# Patient Record
Sex: Female | Born: 1940 | Race: White | Hispanic: No | Marital: Married | Smoking: Never smoker
Health system: Southern US, Community
[De-identification: ages and names within clinical notes are randomized; demographics above are authoritative.]

## PROBLEM LIST (undated history)

## (undated) DIAGNOSIS — E785 Hyperlipidemia, unspecified: Secondary | ICD-10-CM

## (undated) HISTORY — PX: INCONTINENCE SURGERY: SHX676

## (undated) HISTORY — PX: COLONOSCOPY: SHX174

## (undated) HISTORY — PX: ABDOMINAL HYSTERECTOMY: SHX81

## (undated) HISTORY — PX: APPENDECTOMY: SHX54

## (undated) HISTORY — PX: EYE SURGERY: SHX253

---

## 2016-01-17 ENCOUNTER — Ambulatory Visit: Payer: Medicare Other | Admitting: Family Medicine

## 2016-03-28 ENCOUNTER — Encounter: Payer: Self-pay | Admitting: *Deleted

## 2016-03-31 ENCOUNTER — Ambulatory Visit
Admission: RE | Admit: 2016-03-31 | Discharge: 2016-03-31 | Disposition: A | Discharge status: Home / Self Care | Payer: Medicare Other | Source: Ambulatory Visit | Attending: Unknown Physician Specialty | Admitting: Unknown Physician Specialty

## 2016-03-31 ENCOUNTER — Ambulatory Visit: Payer: Medicare Other | Admitting: Certified Registered Nurse Anesthetist

## 2016-03-31 ENCOUNTER — Encounter
Admission: RE | Disposition: A | Discharge status: Home / Self Care | Payer: Self-pay | Source: Ambulatory Visit | Attending: Unknown Physician Specialty

## 2016-03-31 ENCOUNTER — Encounter: Payer: Self-pay | Admitting: *Deleted

## 2016-03-31 DIAGNOSIS — Z1211 Encounter for screening for malignant neoplasm of colon: Secondary | ICD-10-CM | POA: Insufficient documentation

## 2016-03-31 DIAGNOSIS — K640 First degree hemorrhoids: Secondary | ICD-10-CM | POA: Insufficient documentation

## 2016-03-31 DIAGNOSIS — Z79899 Other long term (current) drug therapy: Secondary | ICD-10-CM | POA: Insufficient documentation

## 2016-03-31 DIAGNOSIS — E785 Hyperlipidemia, unspecified: Secondary | ICD-10-CM | POA: Diagnosis not present

## 2016-03-31 DIAGNOSIS — K5730 Diverticulosis of large intestine without perforation or abscess without bleeding: Secondary | ICD-10-CM | POA: Diagnosis not present

## 2016-03-31 DIAGNOSIS — K529 Noninfective gastroenteritis and colitis, unspecified: Secondary | ICD-10-CM | POA: Insufficient documentation

## 2016-03-31 DIAGNOSIS — Z86010 Personal history of colonic polyps: Secondary | ICD-10-CM | POA: Insufficient documentation

## 2016-03-31 DIAGNOSIS — Z90710 Acquired absence of both cervix and uterus: Secondary | ICD-10-CM | POA: Diagnosis not present

## 2016-03-31 HISTORY — PX: COLONOSCOPY WITH PROPOFOL: SHX5780

## 2016-03-31 HISTORY — DX: Hyperlipidemia, unspecified: E78.5

## 2016-03-31 SURGERY — COLONOSCOPY WITH PROPOFOL
Anesthesia: General

## 2016-03-31 MED ORDER — SODIUM CHLORIDE 0.9 % IV SOLN: INTRAVENOUS | Status: DC

## 2016-03-31 MED ORDER — SODIUM CHLORIDE 0.9 % IV SOLN
INTRAVENOUS | Status: DC
  Administered 2016-03-31: 1000 mL via INTRAVENOUS

## 2016-03-31 MED ORDER — PROPOFOL 500 MG/50ML IV EMUL
INTRAVENOUS | Status: DC | PRN
  Administered 2016-03-31: 140 ug/kg/min via INTRAVENOUS

## 2016-03-31 MED ORDER — PROPOFOL 10 MG/ML IV BOLUS
INTRAVENOUS | Status: DC | PRN
  Administered 2016-03-31: 30 mg via INTRAVENOUS

## 2016-03-31 MED ORDER — MIDAZOLAM HCL 2 MG/2ML IJ SOLN
INTRAMUSCULAR | Status: DC | PRN
  Administered 2016-03-31: 1 mg via INTRAVENOUS

## 2016-03-31 NOTE — Anesthesia Preprocedure Evaluation (Signed)
Anesthesia Evaluation  Patient identified by MRN, date of birth, ID band Patient awake    Reviewed: Allergy & Precautions, H&P , NPO status , Patient's Chart, lab work & pertinent test results, reviewed documented beta blocker date and time   History of Anesthesia Complications Negative for: history of anesthetic complications  Airway Mallampati: II  TM Distance: >3 FB Neck ROM: full    Dental no notable dental hx. (+) Caps, Teeth Intact   Pulmonary neg pulmonary ROS,    Pulmonary exam normal breath sounds clear to auscultation       Cardiovascular Exercise Tolerance: Good negative cardio ROS Normal cardiovascular exam Rhythm:regular Rate:Normal     Neuro/Psych negative neurological ROS  negative psych ROS   GI/Hepatic negative GI ROS, Neg liver ROS,   Endo/Other  negative endocrine ROS  Renal/GU negative Renal ROS  negative genitourinary   Musculoskeletal   Abdominal   Peds  Hematology negative hematology ROS (+)   Anesthesia Other Findings Past Medical History: No date: Hyperlipidemia   Reproductive/Obstetrics negative OB ROS                             Anesthesia Physical Anesthesia Plan  ASA: II  Anesthesia Plan: General   Post-op Pain Management:    Induction:   Airway Management Planned:   Additional Equipment:   Intra-op Plan:   Post-operative Plan:   Informed Consent: I have reviewed the patients History and Physical, chart, labs and discussed the procedure including the risks, benefits and alternatives for the proposed anesthesia with the patient or authorized representative who has indicated his/her understanding and acceptance.   Dental Advisory Given  Plan Discussed with: Anesthesiologist, CRNA and Surgeon  Anesthesia Plan Comments:         Anesthesia Quick Evaluation

## 2016-03-31 NOTE — Anesthesia Procedure Notes (Signed)
Date/Time: 03/31/2016 11:23 AM Performed by: Ginger CarneMICHELET, Markus Casten Pre-anesthesia Checklist: Patient identified, Emergency Drugs available, Suction available, Patient being monitored and Timeout performed Patient Re-evaluated:Patient Re-evaluated prior to inductionOxygen Delivery Method: Nasal cannula

## 2016-03-31 NOTE — Transfer of Care (Signed)
Immediate Anesthesia Transfer of Care Note  Patient: Ariel SorrowJudy P Mandley  Procedure(s) Performed: Procedure(s): COLONOSCOPY WITH PROPOFOL (N/A)  Patient Location: PACU  Anesthesia Type:General  Level of Consciousness: sedated  Airway & Oxygen Therapy: Patient Spontanous Breathing and Patient connected to nasal cannula oxygen  Post-op Assessment: Report given to RN and Post -op Vital signs reviewed and stable  Post vital signs: Reviewed and stable  Last Vitals:  Vitals:   03/31/16 1016 03/31/16 1143  BP: 116/69 (!) 107/59  Pulse: 85 66  Resp: 16 15  Temp: 36.3 C 36.3 C    Last Pain:  Vitals:   03/31/16 1016  TempSrc: Tympanic         Complications: No apparent anesthesia complications

## 2016-03-31 NOTE — Op Note (Signed)
[facility] Patient Name: Ariel FeilJudy Hickman Procedure Date: 03/31/2016 11:11 AM MRN: [id number] Account #: [id number] Date of Birth: 04-18-1941 Admit Type: Outpatient Age: [age] [facility] Gender: Female Note Status: Finalized Procedure:            Colonoscopy Indications:          High risk colon cancer surveillance: Personal history                        of colonic polyps Providers:            Scot Junobert T. Hikeem Andersson, MD Referring MD:         Hassell HalimMarcus E. Babaoff MD (Referring MD) Medicines:            Propofol per Anesthesia Complications:        No immediate complications. Procedure:            Pre-Anesthesia Assessment:                       - After reviewing the risks and benefits, the patient                        was deemed in satisfactory condition to undergo the                        procedure.                       After obtaining informed consent, the colonoscope was                        passed under direct vision. Throughout the procedure,                        the patient's blood pressure, pulse, and oxygen                        saturations were monitored continuously. The                        Colonoscope was introduced through the anus and                        advanced to the the cecum, identified by appendiceal                        orifice and ileocecal valve. The colonoscopy was                        performed without difficulty. The patient tolerated the                        procedure well. The quality of the bowel preparation                        was excellent. Findings:      A few small-mouthed diverticula were found in the ascending colon.      Internal hemorrhoids were found during endoscopy. The hemorrhoids were       small and Grade I (internal hemorrhoids that do not prolapse).      The exam was  otherwise without abnormality. Impression:           - Diverticulosis in the ascending colon.   - Internal hemorrhoids.                       - The examination was otherwise normal.                       - No specimens collected. Recommendation:       - Repeat colonoscopy in 5 years for surveillance.                       - Resume previous diet indefinitely. Scot Jun, MD 03/31/2016 11:49:31 AM This report has been signed electronically. Number of Addenda: 0 Note Initiated On: 03/31/2016 11:11 AM Scope Withdrawal Time: 0 hours 8 minutes 10 seconds  Total Procedure Duration: 0 hours 15 minutes 55 seconds       [facility]

## 2016-03-31 NOTE — H&P (Signed)
   Primary Care Physician:  No PCP Per Patient Primary Gastroenterologist:  Dr. Mechele CollinElliott  Pre-Procedure History & Physical: HPI:  Ariel SorrowJudy P Lykens is a 75 y.o. female is here for an colonoscopy.   Past Medical History:  Diagnosis Date  . Hyperlipidemia     Past Surgical History:  Procedure Laterality Date  . ABDOMINAL HYSTERECTOMY    . APPENDECTOMY    . COLONOSCOPY    . INCONTINENCE SURGERY      Prior to Admission medications   Medication Sig Start Date End Date Taking? Authorizing Provider  Coenzyme Q10-Vitamin E (COQ10-VITAMIN E) 100-10 MG-UNIT CAPS Take by mouth.   Yes Historical Provider, MD  magnesium oxide (MAG-OX) 400 MG tablet Take 400 mg by mouth daily.   Yes Historical Provider, MD  omega-3 fish oil (MAXEPA) 1000 MG CAPS capsule Take by mouth.   Yes Historical Provider, MD  SELENIUM PO Take by mouth.   Yes Historical Provider, MD  simvastatin (ZOCOR) 10 MG tablet Take 10 mg by mouth daily.   Yes Historical Provider, MD  vitamin B-12 (CYANOCOBALAMIN) 1000 MCG tablet Take 1,000 mcg by mouth daily.   Yes Historical Provider, MD  vitamin E 100 UNIT capsule Take by mouth daily.   Yes Historical Provider, MD    Allergies as of 02/28/2016  . (Not on File)    History reviewed. No pertinent family history.  Social History   Social History  . Marital status: Married    Spouse name: N/A  . Number of children: N/A  . Years of education: N/A   Occupational History  . Not on file.   Social History Main Topics  . Smoking status: Never Smoker  . Smokeless tobacco: Never Used  . Alcohol use Not on file  . Drug use: Unknown  . Sexual activity: Not on file   Other Topics Concern  . Not on file   Social History Narrative  . No narrative on file    Review of Systems: See HPI, otherwise negative ROS  Physical Exam: BP 116/69   Pulse 85   Temp 97.3 F (36.3 C) (Tympanic)   Resp 16   Ht 5\' 4"  (1.626 m)   Wt 58.1 kg (128 lb)   SpO2 100%   BMI 21.97 kg/m   General:   Alert,  pleasant and cooperative in NAD Head:  Normocephalic and atraumatic. Neck:  Supple; no masses or thyromegaly. Lungs:  Clear throughout to auscultation.    Heart:  Regular rate and rhythm. Abdomen:  Soft, nontender and nondistended. Normal bowel sounds, without guarding, and without rebound.   Neurologic:  Alert and  oriented x4;  grossly normal neurologically.  Impression/Plan: Ariel Hickman is here for an colonoscopy to be [facility]  Risks, benefits, limitations, and alternatives regarding  colonoscopy have been reviewed with the patient.  Questions have been answered.  All parties agreeable.   Lynnae PrudeELLIOTT, Cleopatra Sardo, MD  03/31/2016, 11:16 AM

## 2016-04-01 ENCOUNTER — Encounter: Payer: Self-pay | Admitting: Unknown Physician Specialty

## 2016-04-01 LAB — SURGICAL PATHOLOGY

## 2016-04-01 NOTE — Anesthesia Postprocedure Evaluation (Signed)
Anesthesia Post Note  Patient: Ariel Hickman  Procedure(s) Performed: Procedure(s) (LRB): COLONOSCOPY WITH PROPOFOL (N/A)  Patient location during evaluation: Endoscopy Anesthesia Type: General Level of consciousness: awake and alert Pain management: pain level controlled Vital Signs Assessment: post-procedure vital signs reviewed and stable Respiratory status: spontaneous breathing, nonlabored ventilation, respiratory function stable and patient connected to nasal cannula oxygen Cardiovascular status: blood pressure returned to baseline and stable Postop Assessment: no signs of nausea or vomiting Anesthetic complications: no    Last Vitals:  Vitals:   03/31/16 1203 03/31/16 1213  BP: 118/63 127/67  Pulse: (!) 58 (!) 53  Resp: 17 16  Temp:      Last Pain:  Vitals:   03/31/16 1143  TempSrc: Tympanic                 Lenard SimmerAndrew Coryn Mosso

## 2016-10-28 ENCOUNTER — Other Ambulatory Visit: Payer: Self-pay | Admitting: Family Medicine

## 2016-10-28 DIAGNOSIS — Z1231 Encounter for screening mammogram for malignant neoplasm of breast: Secondary | ICD-10-CM

## 2016-12-02 ENCOUNTER — Ambulatory Visit
Admission: RE | Admit: 2016-12-02 | Discharge: 2016-12-02 | Disposition: A | Discharge status: Home / Self Care | Payer: Medicare Other | Source: Ambulatory Visit | Attending: Family Medicine | Admitting: Family Medicine

## 2016-12-02 DIAGNOSIS — Z1231 Encounter for screening mammogram for malignant neoplasm of breast: Secondary | ICD-10-CM | POA: Diagnosis not present

## 2016-12-04 ENCOUNTER — Inpatient Hospital Stay
Admission: RE | Admit: 2016-12-04 | Discharge: 2016-12-04 | Disposition: A | Discharge status: Home / Self Care | Payer: Self-pay | Source: Ambulatory Visit | Attending: *Deleted | Admitting: *Deleted

## 2016-12-04 ENCOUNTER — Other Ambulatory Visit: Payer: Self-pay | Admitting: *Deleted

## 2016-12-04 DIAGNOSIS — Z9289 Personal history of other medical treatment: Secondary | ICD-10-CM

## 2016-12-09 ENCOUNTER — Inpatient Hospital Stay
Admission: RE | Admit: 2016-12-09 | Discharge: 2016-12-09 | Disposition: A | Discharge status: Home / Self Care | Payer: Self-pay | Source: Ambulatory Visit | Attending: *Deleted | Admitting: *Deleted

## 2016-12-09 ENCOUNTER — Other Ambulatory Visit: Payer: Self-pay | Admitting: *Deleted

## 2016-12-09 DIAGNOSIS — Z9289 Personal history of other medical treatment: Secondary | ICD-10-CM

## 2017-11-10 ENCOUNTER — Other Ambulatory Visit: Payer: Self-pay | Admitting: Family Medicine

## 2017-11-10 DIAGNOSIS — Z1231 Encounter for screening mammogram for malignant neoplasm of breast: Secondary | ICD-10-CM

## 2017-12-15 ENCOUNTER — Ambulatory Visit
Admission: RE | Admit: 2017-12-15 | Discharge: 2017-12-15 | Disposition: A | Discharge status: Home / Self Care | Payer: Medicare Other | Source: Ambulatory Visit | Attending: Family Medicine | Admitting: Family Medicine

## 2017-12-15 ENCOUNTER — Encounter: Payer: Self-pay | Admitting: Radiology

## 2017-12-15 DIAGNOSIS — Z1231 Encounter for screening mammogram for malignant neoplasm of breast: Secondary | ICD-10-CM

## 2018-11-16 ENCOUNTER — Other Ambulatory Visit: Payer: Self-pay | Admitting: Family Medicine

## 2018-11-16 DIAGNOSIS — Z1231 Encounter for screening mammogram for malignant neoplasm of breast: Secondary | ICD-10-CM

## 2019-01-28 ENCOUNTER — Other Ambulatory Visit: Payer: Self-pay

## 2019-01-28 ENCOUNTER — Ambulatory Visit
Admission: RE | Admit: 2019-01-28 | Discharge: 2019-01-28 | Disposition: A | Discharge status: Home / Self Care | Payer: Medicare Other | Source: Ambulatory Visit | Attending: Family Medicine | Admitting: Family Medicine

## 2019-01-28 DIAGNOSIS — Z1231 Encounter for screening mammogram for malignant neoplasm of breast: Secondary | ICD-10-CM | POA: Insufficient documentation

## 2020-01-25 ENCOUNTER — Other Ambulatory Visit: Payer: Self-pay | Admitting: Family Medicine

## 2020-01-25 DIAGNOSIS — Z1231 Encounter for screening mammogram for malignant neoplasm of breast: Secondary | ICD-10-CM

## 2020-02-08 ENCOUNTER — Ambulatory Visit
Admission: RE | Admit: 2020-02-08 | Discharge: 2020-02-08 | Disposition: A | Discharge status: Home / Self Care | Payer: Medicare Other | Source: Ambulatory Visit | Attending: Family Medicine | Admitting: Family Medicine

## 2020-02-08 DIAGNOSIS — Z1231 Encounter for screening mammogram for malignant neoplasm of breast: Secondary | ICD-10-CM | POA: Diagnosis not present

## 2021-01-04 ENCOUNTER — Other Ambulatory Visit: Payer: Self-pay | Admitting: Family Medicine

## 2021-01-04 DIAGNOSIS — Z1231 Encounter for screening mammogram for malignant neoplasm of breast: Secondary | ICD-10-CM

## 2021-01-16 ENCOUNTER — Other Ambulatory Visit: Payer: Self-pay | Admitting: Student

## 2021-01-16 DIAGNOSIS — G8929 Other chronic pain: Secondary | ICD-10-CM

## 2021-01-16 DIAGNOSIS — S46011A Strain of muscle(s) and tendon(s) of the rotator cuff of right shoulder, initial encounter: Secondary | ICD-10-CM

## 2021-01-26 ENCOUNTER — Other Ambulatory Visit: Payer: Self-pay

## 2021-01-26 ENCOUNTER — Ambulatory Visit
Admission: RE | Admit: 2021-01-26 | Discharge: 2021-01-26 | Disposition: A | Discharge status: Home / Self Care | Payer: Medicare Other | Source: Ambulatory Visit | Attending: Student | Admitting: Student

## 2021-01-26 DIAGNOSIS — M25511 Pain in right shoulder: Secondary | ICD-10-CM | POA: Insufficient documentation

## 2021-01-26 DIAGNOSIS — G8929 Other chronic pain: Secondary | ICD-10-CM | POA: Insufficient documentation

## 2021-01-26 DIAGNOSIS — S46011A Strain of muscle(s) and tendon(s) of the rotator cuff of right shoulder, initial encounter: Secondary | ICD-10-CM | POA: Insufficient documentation

## 2021-02-07 ENCOUNTER — Other Ambulatory Visit: Payer: Self-pay | Admitting: Surgery

## 2021-02-08 ENCOUNTER — Other Ambulatory Visit: Payer: Self-pay

## 2021-02-08 ENCOUNTER — Ambulatory Visit
Admission: RE | Admit: 2021-02-08 | Discharge: 2021-02-08 | Disposition: A | Discharge status: Home / Self Care | Payer: Medicare Other | Source: Ambulatory Visit | Attending: Family Medicine | Admitting: Family Medicine

## 2021-02-08 DIAGNOSIS — Z1231 Encounter for screening mammogram for malignant neoplasm of breast: Secondary | ICD-10-CM | POA: Insufficient documentation

## 2021-02-13 ENCOUNTER — Other Ambulatory Visit: Payer: Self-pay | Admitting: Family Medicine

## 2021-02-13 DIAGNOSIS — N6489 Other specified disorders of breast: Secondary | ICD-10-CM

## 2021-02-13 DIAGNOSIS — R928 Other abnormal and inconclusive findings on diagnostic imaging of breast: Secondary | ICD-10-CM

## 2021-02-19 ENCOUNTER — Other Ambulatory Visit: Payer: Self-pay

## 2021-02-19 ENCOUNTER — Other Ambulatory Visit
Admission: RE | Admit: 2021-02-19 | Discharge: 2021-02-19 | Disposition: A | Discharge status: Home / Self Care | Payer: Medicare Other | Source: Ambulatory Visit | Attending: Surgery | Admitting: Surgery

## 2021-02-19 NOTE — Patient Instructions (Addendum)
Your procedure is scheduled on: 02/28/21 - Thursday Report to the Registration Desk on the 1st floor of the [facility]. To find out your arrival time, please call [phone] between 1PM - 3PM on: 02/27/21 - Wednesday Report to [facility] for EKG and Bag on 02/26/21 at 11:00.  REMEMBER: Instructions that are not followed completely may result in serious medical risk, up to and including death; or upon the discretion of your surgeon and anesthesiologist your surgery may need to be rescheduled.  Do not eat food after midnight the night before surgery.  No gum chewing, lozengers or hard candies.  You may however, drink CLEAR liquids up to 2 hours before you are scheduled to arrive for your surgery. Do not drink anything within 2 hours of your scheduled arrival time.  Clear liquids include: - water  - apple juice without pulp - gatorade (not RED, PURPLE, OR BLUE) - black coffee or tea (Do NOT add milk or creamers to the coffee or tea) Do NOT drink anything that is not on this list.  Type 1 and Type 2 diabetics should only drink water.  In addition, your doctor has ordered for you to drink the provided  Ensure Pre-Surgery Clear Carbohydrate Drink  Drinking this carbohydrate drink up to two hours before surgery helps to reduce insulin resistance and improve patient outcomes. Please complete drinking 2 hours prior to scheduled arrival time.  TAKE THESE MEDICATIONS THE MORNING OF SURGERY WITH A SIP OF WATER: None  One week prior to surgery: Stop Anti-inflammatories (NSAIDS) such as Advil, Aleve, Ibuprofen, Motrin, Naproxen, Naprosyn and Aspirin based products such as Excedrin, Goodys Powder, BC Powder.  Stop ANY OVER THE COUNTER supplements until after surgery.  You may however, continue to take Tylenol if needed for pain up until the day of surgery.  No Alcohol for 24 hours before or after surgery.  No Smoking including e-cigarettes for 24 hours prior to surgery.  No chewable  tobacco products for at least 6 hours prior to surgery.  No nicotine patches on the day of surgery.  Do not use any "recreational" drugs for at least a week prior to your surgery.  Please be advised that the combination of cocaine and anesthesia may have negative outcomes, up to and including death. If you test positive for cocaine, your surgery will be cancelled.  On the morning of surgery brush your teeth with toothpaste and water, you may rinse your mouth with mouthwash if you wish. Do not swallow any toothpaste or mouthwash.  Do not wear jewelry, make-up, hairpins, clips or nail polish.  Do not wear lotions, powders, or perfumes.   Do not shave body from the neck down 48 hours prior to surgery just in case you cut yourself which could leave a site for infection.  Also, freshly shaved skin may become irritated if using the CHG soap.  Contact lenses, hearing aids and dentures may not be worn into surgery.  Do not bring valuables to the hospital. [facility] is not responsible for any missing/lost belongings or valuables.   Use CHG Soap or wipes as directed on instruction sheet.  Notify your doctor if there is any change in your medical condition (cold, fever, infection).  Wear comfortable clothing (specific to your surgery type) to the hospital.  After surgery, you can help prevent lung complications by doing breathing exercises.  Take deep breaths and cough every 1-2 hours. Your doctor may order a device called an Incentive Spirometer to help you  take deep breaths. When coughing or sneezing, hold a pillow firmly against your incision with both hands. This is called "splinting." Doing this helps protect your incision. It also decreases belly discomfort.  If you are being admitted to the hospital overnight, leave your suitcase in the car. After surgery it may be brought to your room.  If you are being discharged the day of surgery, you will not be allowed to drive home. You will  need a responsible adult (18 years or older) to drive you home and stay with you that night.   If you are taking public transportation, you will need to have a responsible adult (18 years or older) with you. Please confirm with your physician that it is acceptable to use public transportation.   Please call the Pre-admissions Testing Dept. at [phone] if you have any questions about these instructions.  Surgery Visitation Policy:  Patients undergoing a surgery or procedure may have one family member or support person with them as long as that person is not COVID-19 positive or experiencing its symptoms.  That person may remain in the waiting area during the procedure.  Inpatient Visitation:    Visiting hours are 7 a.m. to 8 p.m. Inpatients will be allowed two visitors daily. The visitors may change each day during the patient's stay. No visitors under the age of 46. Any visitor under the age of 1 must be accompanied by an adult. The visitor must pass COVID-19 screenings, use hand sanitizer when entering and exiting the patient's room and wear a mask at all times, including in the patient's room. Patients must also wear a mask when staff or their visitor are in the room. Masking is required regardless of vaccination status.

## 2021-02-25 ENCOUNTER — Other Ambulatory Visit: Payer: Medicare Other

## 2021-02-26 ENCOUNTER — Encounter: Payer: Self-pay | Admitting: Urgent Care

## 2021-02-26 ENCOUNTER — Other Ambulatory Visit: Payer: Self-pay

## 2021-02-26 ENCOUNTER — Encounter
Admission: RE | Admit: 2021-02-26 | Discharge: 2021-02-26 | Disposition: A | Discharge status: Home / Self Care | Payer: Medicare Other | Source: Ambulatory Visit | Attending: Surgery | Admitting: Surgery

## 2021-02-26 ENCOUNTER — Inpatient Hospital Stay: Admission: RE | Admit: 2021-02-26 | Payer: Medicare Other | Source: Ambulatory Visit

## 2021-02-26 DIAGNOSIS — Z01812 Encounter for preprocedural laboratory examination: Secondary | ICD-10-CM | POA: Diagnosis present

## 2021-02-28 ENCOUNTER — Ambulatory Visit: Payer: Medicare Other

## 2021-02-28 ENCOUNTER — Encounter
Admission: RE | Disposition: A | Discharge status: Home / Self Care | Payer: Self-pay | Source: Home / Self Care | Attending: Surgery

## 2021-02-28 ENCOUNTER — Other Ambulatory Visit: Payer: Self-pay

## 2021-02-28 ENCOUNTER — Encounter: Payer: Self-pay | Admitting: Surgery

## 2021-02-28 ENCOUNTER — Ambulatory Visit: Payer: Medicare Other | Admitting: Anesthesiology

## 2021-02-28 ENCOUNTER — Ambulatory Visit
Admission: RE | Admit: 2021-02-28 | Discharge: 2021-02-28 | Disposition: A | Discharge status: Home / Self Care | Payer: Medicare Other | Attending: Surgery | Admitting: Surgery

## 2021-02-28 DIAGNOSIS — Y9373 Activity, racquet and hand sports: Secondary | ICD-10-CM | POA: Insufficient documentation

## 2021-02-28 DIAGNOSIS — Z79899 Other long term (current) drug therapy: Secondary | ICD-10-CM | POA: Diagnosis not present

## 2021-02-28 DIAGNOSIS — M94211 Chondromalacia, right shoulder: Secondary | ICD-10-CM | POA: Diagnosis not present

## 2021-02-28 DIAGNOSIS — S46011A Strain of muscle(s) and tendon(s) of the rotator cuff of right shoulder, initial encounter: Secondary | ICD-10-CM | POA: Insufficient documentation

## 2021-02-28 DIAGNOSIS — M7521 Bicipital tendinitis, right shoulder: Secondary | ICD-10-CM | POA: Insufficient documentation

## 2021-02-28 DIAGNOSIS — Z419 Encounter for procedure for purposes other than remedying health state, unspecified: Secondary | ICD-10-CM

## 2021-02-28 DIAGNOSIS — M7591 Shoulder lesion, unspecified, right shoulder: Secondary | ICD-10-CM | POA: Insufficient documentation

## 2021-02-28 HISTORY — PX: SHOULDER ARTHROSCOPY WITH SUBACROMIAL DECOMPRESSION, ROTATOR CUFF REPAIR AND BICEP TENDON REPAIR: SHX5687

## 2021-02-28 SURGERY — SHOULDER ARTHROSCOPY WITH SUBACROMIAL DECOMPRESSION, ROTATOR CUFF REPAIR AND BICEP TENDON REPAIR
Anesthesia: Regional | Site: Shoulder | Laterality: Right

## 2021-02-28 MED ORDER — KETAMINE HCL 50 MG/5ML IJ SOSY
PREFILLED_SYRINGE | INTRAMUSCULAR | Status: AC
  Filled 2021-02-28: qty 5

## 2021-02-28 MED ORDER — BUPIVACAINE-EPINEPHRINE 0.5% -1:200000 IJ SOLN
INTRAMUSCULAR | Status: DC | PRN
  Administered 2021-02-28: 30 mL

## 2021-02-28 MED ORDER — BUPIVACAINE HCL (PF) 0.5 % IJ SOLN
INTRAMUSCULAR | Status: AC
  Filled 2021-02-28: qty 10

## 2021-02-28 MED ORDER — HYDROCODONE-ACETAMINOPHEN 7.5-325 MG PO TABS
1.0000 | ORAL_TABLET | ORAL | Status: DC | PRN
Start: 2021-02-28 — End: 2021-02-28

## 2021-02-28 MED ORDER — SODIUM CHLORIDE FLUSH 0.9 % IV SOLN
INTRAVENOUS | Status: AC
  Filled 2021-02-28: qty 10

## 2021-02-28 MED ORDER — GLYCOPYRROLATE 0.2 MG/ML IJ SOLN
INTRAMUSCULAR | Status: DC | PRN
  Administered 2021-02-28: .2 mg via INTRAVENOUS

## 2021-02-28 MED ORDER — PROPOFOL 10 MG/ML IV BOLUS
INTRAVENOUS | Status: AC
  Filled 2021-02-28: qty 20

## 2021-02-28 MED ORDER — DEXAMETHASONE SODIUM PHOSPHATE 10 MG/ML IJ SOLN
INTRAMUSCULAR | Status: DC | PRN
  Administered 2021-02-28: 10 mg via INTRAVENOUS

## 2021-02-28 MED ORDER — LIDOCAINE HCL (CARDIAC) PF 100 MG/5ML IV SOSY
PREFILLED_SYRINGE | INTRAVENOUS | Status: DC | PRN
  Administered 2021-02-28: 50 mg via INTRAVENOUS

## 2021-02-28 MED ORDER — FAMOTIDINE 20 MG PO TABS
20.0000 mg | ORAL_TABLET | Freq: Once | ORAL | Status: AC
  Administered 2021-02-28: 20 mg via ORAL

## 2021-02-28 MED ORDER — FENTANYL CITRATE (PF) 100 MCG/2ML IJ SOLN
INTRAMUSCULAR | Status: AC
  Administered 2021-02-28: 50 ug via INTRAVENOUS
  Filled 2021-02-28: qty 2

## 2021-02-28 MED ORDER — MIDAZOLAM HCL 2 MG/2ML IJ SOLN
INTRAMUSCULAR | Status: AC
  Administered 2021-02-28: 0.5 mg via INTRAVENOUS
  Filled 2021-02-28: qty 2

## 2021-02-28 MED ORDER — METOCLOPRAMIDE HCL 10 MG PO TABS
5.0000 mg | ORAL_TABLET | Freq: Three times a day (TID) | ORAL | Status: DC | PRN
Start: 2021-02-28 — End: 2021-02-28

## 2021-02-28 MED ORDER — LACTATED RINGERS IV SOLN: INTRAVENOUS | Status: DC

## 2021-02-28 MED ORDER — MIDAZOLAM HCL 2 MG/2ML IJ SOLN: 1.0000 mg | INTRAMUSCULAR | Status: DC | PRN

## 2021-02-28 MED ORDER — CHLORHEXIDINE GLUCONATE 0.12 % MT SOLN
OROMUCOSAL | Status: AC
  Filled 2021-02-28: qty 15

## 2021-02-28 MED ORDER — PROPOFOL 10 MG/ML IV BOLUS
INTRAVENOUS | Status: DC | PRN
  Administered 2021-02-28: 80 mg via INTRAVENOUS

## 2021-02-28 MED ORDER — HYDROCODONE-ACETAMINOPHEN 5-325 MG PO TABS
1.0000 | ORAL_TABLET | Freq: Four times a day (QID) | ORAL | Status: DC | PRN
Start: 2021-02-28 — End: 2021-02-28

## 2021-02-28 MED ORDER — CEFAZOLIN SODIUM-DEXTROSE 2-4 GM/100ML-% IV SOLN
INTRAVENOUS | Status: AC
  Filled 2021-02-28: qty 100

## 2021-02-28 MED ORDER — ACETAMINOPHEN 10 MG/ML IV SOLN
INTRAVENOUS | Status: AC
  Filled 2021-02-28: qty 100

## 2021-02-28 MED ORDER — ONDANSETRON HCL 4 MG/2ML IJ SOLN
INTRAMUSCULAR | Status: DC | PRN
  Administered 2021-02-28: 4 mg via INTRAVENOUS

## 2021-02-28 MED ORDER — CEFAZOLIN SODIUM-DEXTROSE 2-4 GM/100ML-% IV SOLN
2.0000 g | INTRAVENOUS | Status: AC
  Administered 2021-02-28: 2 g via INTRAVENOUS

## 2021-02-28 MED ORDER — FENTANYL CITRATE (PF) 100 MCG/2ML IJ SOLN
INTRAMUSCULAR | Status: AC
  Filled 2021-02-28: qty 2

## 2021-02-28 MED ORDER — ORAL CARE MOUTH RINSE: 15.0000 mL | Freq: Once | OROMUCOSAL | Status: AC

## 2021-02-28 MED ORDER — PROMETHAZINE HCL 25 MG/ML IJ SOLN
INTRAMUSCULAR | Status: AC
  Filled 2021-02-28: qty 1

## 2021-02-28 MED ORDER — MIDAZOLAM HCL 2 MG/2ML IJ SOLN: 0.5000 mg | Freq: Once | INTRAMUSCULAR | Status: AC

## 2021-02-28 MED ORDER — EPINEPHRINE PF 1 MG/ML IJ SOLN
INTRAMUSCULAR | Status: AC
  Filled 2021-02-28: qty 1

## 2021-02-28 MED ORDER — GLYCOPYRROLATE 0.2 MG/ML IJ SOLN
INTRAMUSCULAR | Status: AC
  Filled 2021-02-28: qty 1

## 2021-02-28 MED ORDER — PHENYLEPHRINE HCL (PRESSORS) 10 MG/ML IV SOLN
INTRAVENOUS | Status: DC | PRN
  Administered 2021-02-28 (×4): 100 ug via INTRAVENOUS

## 2021-02-28 MED ORDER — ONDANSETRON HCL 4 MG PO TABS: 4.0000 mg | ORAL_TABLET | Freq: Four times a day (QID) | ORAL | Status: DC | PRN

## 2021-02-28 MED ORDER — BUPIVACAINE LIPOSOME 1.3 % IJ SUSP
INTRAMUSCULAR | Status: AC
  Filled 2021-02-28: qty 20

## 2021-02-28 MED ORDER — EPHEDRINE 5 MG/ML INJ
INTRAVENOUS | Status: AC
  Filled 2021-02-28: qty 10

## 2021-02-28 MED ORDER — KETOROLAC TROMETHAMINE 30 MG/ML IJ SOLN
INTRAMUSCULAR | Status: DC | PRN
  Administered 2021-02-28: 30 mg via INTRAVENOUS

## 2021-02-28 MED ORDER — HYDROCODONE-ACETAMINOPHEN 5-325 MG PO TABS: 1.0000 | ORAL_TABLET | Freq: Four times a day (QID) | ORAL | 0 refills | Status: AC | PRN

## 2021-02-28 MED ORDER — METOCLOPRAMIDE HCL 5 MG/ML IJ SOLN
10.0000 mg | Freq: Once | INTRAMUSCULAR | Status: AC
  Administered 2021-02-28: 10 mg via INTRAVENOUS

## 2021-02-28 MED ORDER — POTASSIUM CHLORIDE IN NACL 20-0.9 MEQ/L-% IV SOLN
INTRAVENOUS | Status: DC
  Filled 2021-02-28 (×3): qty 1000

## 2021-02-28 MED ORDER — FENTANYL CITRATE (PF) 100 MCG/2ML IJ SOLN: 50.0000 ug | Freq: Once | INTRAMUSCULAR | Status: AC

## 2021-02-28 MED ORDER — METOCLOPRAMIDE HCL 5 MG/ML IJ SOLN: 5.0000 mg | Freq: Three times a day (TID) | INTRAMUSCULAR | Status: DC | PRN

## 2021-02-28 MED ORDER — SUGAMMADEX SODIUM 200 MG/2ML IV SOLN
INTRAVENOUS | Status: DC | PRN
  Administered 2021-02-28: 150 mg via INTRAVENOUS

## 2021-02-28 MED ORDER — METOCLOPRAMIDE HCL 5 MG/ML IJ SOLN
INTRAMUSCULAR | Status: AC
  Filled 2021-02-28: qty 2

## 2021-02-28 MED ORDER — BUPIVACAINE LIPOSOME 1.3 % IJ SUSP
INTRAMUSCULAR | Status: DC | PRN
  Administered 2021-02-28: 10 mL

## 2021-02-28 MED ORDER — ROCURONIUM BROMIDE 100 MG/10ML IV SOLN
INTRAVENOUS | Status: DC | PRN
  Administered 2021-02-28: 50 mg via INTRAVENOUS

## 2021-02-28 MED ORDER — ACETAMINOPHEN 10 MG/ML IV SOLN
INTRAVENOUS | Status: DC | PRN
  Administered 2021-02-28: 900 mg via INTRAVENOUS

## 2021-02-28 MED ORDER — ONDANSETRON HCL 4 MG/2ML IJ SOLN
INTRAMUSCULAR | Status: AC
  Filled 2021-02-28: qty 2

## 2021-02-28 MED ORDER — BUPIVACAINE-EPINEPHRINE (PF) 0.5% -1:200000 IJ SOLN
INTRAMUSCULAR | Status: AC
  Filled 2021-02-28: qty 30

## 2021-02-28 MED ORDER — ONDANSETRON HCL 4 MG/2ML IJ SOLN
4.0000 mg | Freq: Four times a day (QID) | INTRAMUSCULAR | Status: DC | PRN
  Administered 2021-02-28: 4 mg via INTRAVENOUS

## 2021-02-28 MED ORDER — ACETAMINOPHEN 500 MG PO TABS: 500.0000 mg | ORAL_TABLET | Freq: Four times a day (QID) | ORAL | Status: DC

## 2021-02-28 MED ORDER — FAMOTIDINE 20 MG PO TABS
ORAL_TABLET | ORAL | Status: AC
  Filled 2021-02-28: qty 1

## 2021-02-28 MED ORDER — BUPIVACAINE HCL (PF) 0.5 % IJ SOLN
INTRAMUSCULAR | Status: DC | PRN
  Administered 2021-02-28: 10 mL

## 2021-02-28 MED ORDER — FENTANYL CITRATE (PF) 100 MCG/2ML IJ SOLN
50.0000 ug | INTRAMUSCULAR | Status: DC | PRN
Start: 2021-02-28 — End: 2021-02-28

## 2021-02-28 MED ORDER — CHLORHEXIDINE GLUCONATE 0.12 % MT SOLN
15.0000 mL | Freq: Once | OROMUCOSAL | Status: AC
  Administered 2021-02-28: 15 mL via OROMUCOSAL

## 2021-02-28 MED ORDER — KETOROLAC TROMETHAMINE 30 MG/ML IJ SOLN
INTRAMUSCULAR | Status: AC
  Filled 2021-02-28: qty 1

## 2021-02-28 MED ORDER — PROMETHAZINE HCL 25 MG/ML IJ SOLN
6.2500 mg | Freq: Four times a day (QID) | INTRAMUSCULAR | Status: DC | PRN
  Administered 2021-02-28: 6.25 mg via INTRAVENOUS

## 2021-02-28 SURGICAL SUPPLY — 55 items
ANCH SUT 2 2.9 2 LD TPR NDL (Anchor) ×2 IMPLANT
ANCH SUT 2 2/0 ABS BRD STRL (Anchor) ×1 IMPLANT
ANCH SUT 5.5 KNTLS (Anchor) ×1 IMPLANT
ANCHOR HEALICOIL REGEN 5.5 (Anchor) ×2 IMPLANT
ANCHOR JUGGERKNOT WTAP NDL 2.9 (Anchor) ×4 IMPLANT
ANCHOR SUT W/ ORTHOCORD (Anchor) ×2 IMPLANT
APL PRP STRL LF DISP 70% ISPRP (MISCELLANEOUS) ×1
BIT DRILL JUGRKNT W/NDL BIT2.9 (DRILL) ×1 IMPLANT
BLADE FULL RADIUS 3.5 (BLADE) ×2 IMPLANT
BUR ACROMIONIZER 4.0 (BURR) ×2 IMPLANT
CANNULA SHAVER 8MMX76MM (CANNULA) ×2 IMPLANT
CHLORAPREP W/TINT 26 (MISCELLANEOUS) ×2 IMPLANT
COOLER POLAR GLACIER W/PUMP (MISCELLANEOUS) ×2 IMPLANT
COVER MAYO STAND REUSABLE (DRAPES) ×2 IMPLANT
DILATOR 5.5 THREADED HEALICOIL (MISCELLANEOUS) ×2 IMPLANT
DRAPE IMP U-DRAPE 54X76 (DRAPES) ×4 IMPLANT
DRILL JUGGERKNOT W/NDL BIT 2.9 (DRILL) ×2
ELECT CAUTERY BLADE 6.4 (BLADE) ×2 IMPLANT
ELECT REM PT RETURN 9FT ADLT (ELECTROSURGICAL) ×2
ELECTRODE REM PT RTRN 9FT ADLT (ELECTROSURGICAL) ×1 IMPLANT
GAUZE SPONGE 4X4 12PLY STRL (GAUZE/BANDAGES/DRESSINGS) ×2 IMPLANT
GAUZE XEROFORM 1X8 LF (GAUZE/BANDAGES/DRESSINGS) ×2 IMPLANT
GLOVE SRG 8 PF TXTR STRL LF DI (GLOVE) ×1 IMPLANT
GLOVE SURG ENC MOIS LTX SZ7.5 (GLOVE) ×4 IMPLANT
GLOVE SURG ENC MOIS LTX SZ8 (GLOVE) ×4 IMPLANT
GLOVE SURG UNDER LTX SZ8 (GLOVE) ×2 IMPLANT
GLOVE SURG UNDER POLY LF SZ8 (GLOVE) ×2
GOWN STRL REUS W/ TWL LRG LVL3 (GOWN DISPOSABLE) ×1 IMPLANT
GOWN STRL REUS W/ TWL XL LVL3 (GOWN DISPOSABLE) ×1 IMPLANT
GOWN STRL REUS W/TWL LRG LVL3 (GOWN DISPOSABLE) ×2
GOWN STRL REUS W/TWL XL LVL3 (GOWN DISPOSABLE) ×2
GRASPER SUT 15 45D LOW PRO (SUTURE) ×2 IMPLANT
IV LACTATED RINGER IRRG 3000ML (IV SOLUTION) ×4
IV LR IRRIG 3000ML ARTHROMATIC (IV SOLUTION) ×2 IMPLANT
KIT CANNULA 8X76-LX IN CANNULA (CANNULA) ×2 IMPLANT
MANIFOLD NEPTUNE II (INSTRUMENTS) ×2 IMPLANT
MASK FACE SPIDER DISP (MASK) ×2 IMPLANT
MAT ABSORB  FLUID 56X50 GRAY (MISCELLANEOUS) ×1
MAT ABSORB FLUID 56X50 GRAY (MISCELLANEOUS) ×1 IMPLANT
PACK ARTHROSCOPY SHOULDER (MISCELLANEOUS) ×2 IMPLANT
PAD WRAPON POLAR SHDR UNIV (MISCELLANEOUS) ×1 IMPLANT
PASSER SUT FIRSTPASS SELF (INSTRUMENTS) IMPLANT
SLING ARM LRG DEEP (SOFTGOODS) IMPLANT
SLING ULTRA II LG (MISCELLANEOUS) ×2 IMPLANT
SPONGE T-LAP 18X18 ~~LOC~~+RFID (SPONGE) ×2 IMPLANT
STAPLER SKIN PROX 35W (STAPLE) ×2 IMPLANT
STRAP SAFETY 5IN WIDE (MISCELLANEOUS) ×2 IMPLANT
SUT ETHIBOND 0 MO6 C/R (SUTURE) ×2 IMPLANT
SUT ULTRABRAID 2 COBRAID 38 (SUTURE) IMPLANT
SUT VIC AB 2-0 CT1 27 (SUTURE) ×4
SUT VIC AB 2-0 CT1 TAPERPNT 27 (SUTURE) ×2 IMPLANT
TAPE MICROFOAM 4IN (TAPE) IMPLANT
TUBING ARTHRO INFLOW-ONLY STRL (TUBING) ×2 IMPLANT
WAND WEREWOLF FLOW 90D (MISCELLANEOUS) ×2 IMPLANT
WRAPON POLAR PAD SHDR UNIV (MISCELLANEOUS) ×2

## 2021-02-28 NOTE — Anesthesia Procedure Notes (Signed)
Procedure Name: Intubation Date/Time: 02/28/2021 1:07 PM Performed by: Reece Agar, CRNA Pre-anesthesia Checklist: Patient identified, Emergency Drugs available, Suction available and Patient being monitored Patient Re-evaluated:Patient Re-evaluated prior to induction Oxygen Delivery Method: Circle system utilized Preoxygenation: Pre-oxygenation with 100% oxygen Induction Type: IV induction Ventilation: Oral airway inserted - appropriate to patient size Laryngoscope Size: Hyacinth Meeker and 2 Grade View: Grade II Tube type: Oral Tube size: 6.5 mm Number of attempts: 1 Airway Equipment and Method: Stylet and Oral airway Placement Confirmation: ETT inserted through vocal cords under direct vision, positive ETCO2 and breath sounds checked- equal and bilateral Secured at: 20 cm Tube secured with: Tape Dental Injury: Teeth and Oropharynx as per pre-operative assessment and Injury to lip  Comments: 1cm lip laceration in center of upper lip following DL. Treated with lubricant.

## 2021-02-28 NOTE — Anesthesia Preprocedure Evaluation (Signed)
Anesthesia Evaluation  Patient identified by MRN, date of birth, ID band Patient awake    Reviewed: Allergy & Precautions, NPO status , Patient's Chart, lab work & pertinent test results  History of Anesthesia Complications Negative for: history of anesthetic complications  Airway Mallampati: II  TM Distance: >3 FB Neck ROM: Full    Dental no notable dental hx. (+) Teeth Intact   Pulmonary neg pulmonary ROS, neg sleep apnea, neg COPD, Patient abstained from smoking.Not current smoker,    Pulmonary exam normal breath sounds clear to auscultation       Cardiovascular Exercise Tolerance: Good METS(-) hypertension(-) CAD and (-) Past MI negative cardio ROS  (-) dysrhythmias  Rhythm:Regular Rate:Normal - Systolic murmurs    Neuro/Psych negative neurological ROS  negative psych ROS   GI/Hepatic neg GERD  ,(+)     (-) substance abuse  ,   Endo/Other  neg diabetes  Renal/GU negative Renal ROS     Musculoskeletal   Abdominal   Peds  Hematology   Anesthesia Other Findings Past Medical History: No date: Hyperlipidemia  Reproductive/Obstetrics                             Anesthesia Physical Anesthesia Plan  ASA: 2  Anesthesia Plan: General   Post-op Pain Management:  Regional for Post-op pain   Induction: Intravenous  PONV Risk Score and Plan: 3 and Ondansetron, Dexamethasone, Midazolam and Treatment may vary due to age or medical condition  Airway Management Planned: Oral ETT  Additional Equipment: None  Intra-op Plan:   Post-operative Plan: Extubation in OR  Informed Consent: I have reviewed the patients History and Physical, chart, labs and discussed the procedure including the risks, benefits and alternatives for the proposed anesthesia with the patient or authorized representative who has indicated his/her understanding and acceptance.     Dental advisory given  Plan  Discussed with: CRNA and Surgeon  Anesthesia Plan Comments: (Discussed risks of anesthesia with patient, including PONV, sore throat, lip/dental damage. Rare risks discussed as well, such as cardiorespiratory and neurological sequelae. Patient understands.  Discussed r/b/a of interscalene block, including elective nature. Risks discussed: - Rare: bleeding, infection, nerve damage - shortness of breath from hemidiaphragmatic paralysis - unilateral horner's syndrome - poor/non-working blocks Patient understands and agrees.  Marland Kitchen)       Anesthesia Quick Evaluation

## 2021-02-28 NOTE — Transfer of Care (Signed)
Immediate Anesthesia Transfer of Care Note  Patient: Ariel Hickman  Procedure(s) Performed: SHOULDER ARTHROSCOPY WITH DEBRIDEMENT, DECOMPRESSION, ROTATOR CUFF REPAIR AND BICEPS TENODESIS. (Right: Shoulder)  Patient Location: PACU  Anesthesia Type:GA combined with regional for post-op pain  Level of Consciousness: awake and drowsy  Airway & Oxygen Therapy: Patient Spontanous Breathing and Patient connected to nasal cannula oxygen  Post-op Assessment: Report given to RN and Post -op Vital signs reviewed and stable  Post vital signs: Reviewed and stable  Last Vitals:  Vitals Value Taken Time  BP    Temp    Pulse    Resp    SpO2      Last Pain:  Vitals:   02/28/21 1100  TempSrc: Temporal  PainSc: 0-No pain         Complications: No notable events documented.

## 2021-02-28 NOTE — H&P (Signed)
History of Present Illness: Ariel Hickman is a 80 y.o. female who presents today for repeat evaluation of right shoulder pain discussed her recent right shoulder MRI results. The patient was playing tennis this year when she felt a sudden pain in the right shoulder. Since then the patient has had increased discomfort with range of motion activity especially lifting her right arm out to the side. Patient did undergo formal physical therapy which did not provide significant benefit. The patient was also taking meloxicam with only mild relief. Pain score today is a 2 out of 10. Because of ongoing pain and discomfort and decreased range of motion, MRI scan of the right shoulder was ordered for evaluation of possible underlying rotator cuff tear. The patient presents today to discuss recent right shoulder MRI results. The patient denies any numbness or tingling right upper extremity. Denies any repeat falls or trauma. She denies any personal history of heart attack, stroke, asthma or COPD. No personal history of blood clots.  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  Physical Exam: BP 128/82  Ht 160 cm (5' 2.99")  Wt 58.1 kg (128 lb)  BMI 22.68 kg/m  General/Constitutional: The patient appears to be well-nourished, well-developed, and in no acute  distress. Neuro/Psych: Normal mood and affect, oriented to person, place and time. Eyes: Non-icteric. Pupils are equal, round, and reactive to light, and exhibit synchronous movement. ENT: Unremarkable. Lymphatic: No palpable adenopathy. Respiratory: Lungs clear to auscultation, Normal chest excursion, No wheezes and Non-labored breathing Cardiovascular: Regular rate and rhythm. No murmurs. and No edema, swelling or tenderness, except as noted in detailed exam. Integumentary: No impressive skin lesions present, except as noted in detailed exam. Musculoskeletal: Unremarkable, except as noted in detailed exam.  General: Well developed, well nourished 80 y.o. female in no apparent distress. Normal affect. Normal communication. Patient answers questions appropriately. The patient has a normal gait. There is no antalgic component. There is no hip lurch.   Right Upper Extremity: Examination of the right shoulder and arm showed no bony abnormality or edema. Patient is able to forward flex 95 degrees, abduction 100 degrees with moderate pain. With the right arm abducted 90 degrees the patient tolerate external rotation 90 degrees and internal rotation 70 degrees with pain at the extremes. She does have full passive range of motion. The patient has a positive Hawkins test and a positive impingement test. The patient has a positive drop arm test. The patient is non-tender along the deltoid muscle. There is moderate subacromial space tenderness with no AC joint tenderness. Increased pain with speeds test to the right upper extremity. She is nontender palpation over the proximal bicep tendon however. The patient has no instability of the shoulder with anterior-posterior motion. There is a negative sulcus sign. The rotator cuff muscle strength is 3-4/5 with supraspinatus, 4/5 with  internal rotation, and 4/5 with external rotation. There is no crepitus with range of motion activities. No evidence of scapular winging  to the right upper extremity. The patient is nontender palpation along the serratus anterior muscle.  Neurological: The patient has sensation that is intact to light touch and pinprick bilaterally. The patient has normal grip strength. The patient has full biceps, wrist extension, grip, and interosseous strength. The patient has 2 + DTRs bilaterally.  Vascular: The patient has less than 2 second capillary refill. The patient has normal ulnar and radial pulses. The patient has normal warmth to touch.   Imaging: True AP, Y-scapular, and axillary views of the right shoulder were obtained at a previous visit and reviewed today. These films demonstrate no evidence for fractures, lytic lesions, or significant degenerative changes. The subacromial space is well-maintained. There is no subacromial or infra-clavicular spurring. She demonstrates a Type I acromion.  MRI OF THE RIGHT SHOULDER WITHOUT CONTRAST:  1. Edema within or along the left serratus anterior muscle and possibly  along the adjacent intercostal musculature. This could be from  muscle strain, but the region is poorly characterized. Consider  chest radiography to also exclude an upper right rib injury.  2. Subacromial subdeltoid bursitis.  3. Partial thickness bursal surface tear of the distal supraspinatus  tendon with mild supraspinatus tendinopathy.  4. Suspected mild synovitis in the glenohumeral joint.   Impression: 1. Traumatic complete tear of right rotator cuff. 2. Biceps tendonitis, right. 3. Right rotator cuff tendonitis  Plan:  1. Treatment options were discussed today with the patient. 2. The patient continues to have increased discomfort with attempted range of motion of the right shoulder at this time. 3. After discussion of the risk and benefits of surgical versus nonsurgical treatment options, the patient would like to proceed with more aggressive treatment options. The patient was instructed on the risk and benefits  of a right shoulder arthroscopy with debridement, decompression, rotator cuff repair and possible biceps tenodesis. Surgery will be performed by Dr. Joice Lofts. 4. The patient was offered and received a right shoulder slingshot brace which she will wear following surgery. 5. The patient will follow-up per standard postop protocol. 6. They can call the clinic they have any questions, new symptoms develop or symptoms worsen.  The procedure was discussed with the patient, as were the potential risks (including bleeding, infection, nerve and/or blood vessel injury, persistent or recurrent pain, failure of the repair, progression of arthritis, need for further surgery, blood clots, strokes, heart attacks and/or arhythmias, pneumonia, etc.) and benefits. The patient states her understanding and wishes to proceed.   H&P reviewed and patient re-examined. No changes.

## 2021-02-28 NOTE — Op Note (Signed)
02/28/2021  2:39 PM  Patient:   Ariel Hickman  Pre-Op Diagnosis:   Traumatic bursal-sided partial-thickness rotator cuff tear, right shoulder.  Post-Op Diagnosis:   Traumatic near full-thickness bursal-sided supraspinatus tear, partial-thickness subscapularis tendon tear degenerative fraying of the labrum, early degenerative joint disease, and biceps tendinopathy, right shoulder.  Procedure:   Extensive arthroscopic debridement, arthroscopic subscapularis tendon repair, arthroscopic subacromial decompression, mini-open supraspinatus tendon repair, and mini-open biceps tenodesis, right shoulder.  Anesthesia:   General endotracheal with interscalene block using Exparel placed preoperatively by the anesthesiologist.  Surgeon:   Maryagnes Amos, MD  Assistant:   Horris Latino, PA-C  Findings:   As above. There was extensive degenerative fraying of the superior and posterior superior portions of the glenoid labrum without frank detachment from the glenoid rim. There was an articular sided partial-thickness tear involving the superior insertional fibers of the subscapularis tendon, as well as a near full-thickness bursal sided focal tear involving the mid insertional fibers of the supraspinatus tendon. The remainder the rotator cuff was in satisfactory condition. There was significant inflammation of the biceps tendon without partial or full-thickness tearing. There were grade 3 chondromalacial changes involving the humeral head and the central portion of the glenoid.  Complications:   None  Fluids:   1000 cc  Estimated blood loss:   10 cc  Tourniquet time:   None  Drains:   None  Closure:   Staples      Brief clinical note:   The patient is a 80 year old female with a 6 month history of right shoulder pain following a tennis-related injury. The patient's symptoms have progressed despite medications, activity modification, etc. The patient's history and examination are consistent with a  probable rotator cuff tear. These findings were confirmed by MRI scan. The patient presents at this time for definitive management of these shoulder symptoms.  Procedure:   The patient underwent placement of an interscalene block using Exparel by the anesthesiologist in the preoperative holding area before being brought into the operating room and lain in the supine position. The patient then underwent general endotracheal intubation and anesthesia before being repositioned in the beach chair position using the beach chair positioner. The right shoulder and upper extremity were prepped with ChloraPrep solution before being draped sterilely. Preoperative antibiotics were administered. A timeout was performed to confirm the proper surgical site before the expected portal sites and incision site were injected with 0.5% Sensorcaine with epinephrine.   A posterior portal was created and the glenohumeral joint thoroughly inspected with the findings as described above. An anterior portal was created using an outside-in technique. The labrum and rotator cuff were further probed, again confirming the above-noted findings. The areas of labral fraying were debrided back to stable margins using the full-radius resector, as were areas of synovitis. The torn portion of the subscapularis tendon also was debrided back to stable margins and the full-radius resector. Finally, areas of loose articular cartilage on the humeral articular surface were debrided back to stable margins using the full-radius resector. The ArthroCare wand was inserted and used to release the biceps tendon from its labral anchor. It also was used to obtain hemostasis as well as to "anneal" the labrum superiorly and anteriorly.   The articular sided partial-thickness tear involving the superior portion of the subscapularis tendon was repaired using a single Mitek bio knotless anchor placed through the anterior portal site. A separate superolateral portal  site was created via an outside in technique to act as a  working portal. The repair was assessed both by probing and with external rotation and found to be stable. The instruments were removed from the joint after suctioning the excess fluid.  The camera was repositioned through the posterior portal into the subacromial space. A separate lateral portal was created using an outside-in technique. The 3.5 mm full-radius resector was introduced and used to perform a subtotal bursectomy. The ArthroCare wand was then inserted and used to remove the periosteal tissue off the undersurface of the anterior third of the acromion as well as to recess the coracoacromial ligament from its attachment along the anterior and lateral margins of the acromion. The 4.0 mm acromionizing bur was introduced and used to complete the decompression by removing the undersurface of the anterior third of the acromion. The full radius resector was reintroduced to remove any residual bony debris before the ArthroCare wand was reintroduced to obtain hemostasis. The instruments were then removed from the subacromial space after suctioning the excess fluid.  An approximately 4-5 cm incision was made over the anterolateral aspect of the shoulder beginning at the anterolateral corner of the acromion and extending distally in line with the bicipital groove. This incision was carried down through the subcutaneous tissues to expose the deltoid fascia. The raphae between the anterior and middle thirds was identified and this plane developed to provide access into the subacromial space. Additional bursal tissues were debrided sharply using Metzenbaum scissors. The rotator cuff tear was readily identified. The margins were debrided sharply with a #15 blade and the exposed greater tuberosity roughened with a rongeur. The tear was repaired using a single Biomet 2.9 mm JuggerKnot anchor. These sutures were then brought back laterally and secured using one  [employer] Healicoil knotless RegenaSorb anchor to create a two-layer closure. An apparent watertight closure was obtained.  The bicipital groove was identified by palpation and opened for 1-1.5 cm. The biceps tendon stump was retrieved through this defect. The floor of the bicipital groove was roughened with a curet before a Biomet 2.9 mm JuggerKnot anchor was inserted. Both sets of sutures were passed through the biceps tendon and tied securely to effect the tenodesis. The bicipital sheath was reapproximated using two #0 Ethibond interrupted sutures, incorporating the biceps tendon to further reinforce the tenodesis.  The wound was copiously irrigated with sterile saline solution before the deltoid raphae was reapproximated using 2-0 Vicryl interrupted sutures. The subcutaneous tissues were closed in two layers using 2-0 Vicryl interrupted sutures before the skin was closed using staples. The portal sites also were closed using staples. A sterile bulky dressing was applied to the shoulder before the arm was placed into a shoulder immobilizer. The patient was then awakened, extubated, and returned to the recovery room in satisfactory condition after tolerating the procedure well.

## 2021-02-28 NOTE — Discharge Instructions (Addendum)
Orthopedic discharge instructions: Keep dressing dry and intact.  May shower after dressing changed on post-op day #4 (Monday).  Cover staples with Band-Aids after drying off. Apply ice frequently to shoulder. Take ibuprofen 600-800 mg TID with meals for 5-7 days, then as necessary. Take hydrocodone as prescribed when needed.  May supplement with ES Tylenol if necessary. Keep shoulder immobilizer on at all times except may remove for bathing purposes. Follow-up in 10-14 days or as scheduled. AMBULATORY SURGERY  DISCHARGE INSTRUCTIONS   The drugs that you were given will stay in your system until tomorrow so for the next 24 hours you should not:  Drive an automobile Make any legal decisions Drink any alcoholic beverage   You may resume regular meals tomorrow.  Today it is better to start with liquids and gradually work up to solid foods.  You may eat anything you prefer, but it is better to start with liquids, then soup and crackers, and gradually work up to solid foods.   Please notify your doctor immediately if you have any unusual bleeding, trouble breathing, redness and pain at the surgery site, drainage, fever, or pain not relieved by medication.    Additional Instructions:   Please contact your physician with any problems or Same Day Surgery at [phone], Monday through Friday 6 am to 4 pm, or [place] [facility] number at [phone].

## 2021-02-28 NOTE — Anesthesia Procedure Notes (Signed)
Anesthesia Regional Block: Interscalene brachial plexus block   Pre-Anesthetic Checklist: , timeout performed,  Correct Patient, Correct Site, Correct Laterality,  Correct Procedure, Correct Position, site marked,  Risks and benefits discussed,  Surgical consent,  Pre-op evaluation,  At surgeon's request and post-op pain management  Laterality: Right  Prep: chloraprep       Needles:  Injection technique: Single-shot  Needle Type: Echogenic Needle     Needle Length: 4cm  Needle Gauge: 25     Additional Needles:   Narrative:  Start time: 02/28/2021 12:20 PM End time: 02/28/2021 12:25 PM Injection made incrementally with aspirations every 5 mL.  Performed by: Personally  Anesthesiologist: Corinda Gubler, MD  Additional Notes: Patient's chart reviewed and they were deemed appropriate candidate for procedure, at surgeon's request. Patient educated about risks, benefits, and alternatives of the block including but not limited to: temporary or permanent nerve damage, bleeding, infection, damage to surround tissues, pneumothorax, hemidiaphragmatic paralysis, unilateral Horner's syndrome, block failure, local anesthetic toxicity. Patient expressed understanding. A formal time-out was conducted consistent with institution rules.  Monitors were applied, and minimal sedation used (see nursing record). The site was prepped with skin prep and allowed to dry, and sterile gloves were used. A high frequency linear ultrasound probe with probe cover was utilized throughout. C5-7 nerve roots located and appeared anatomically normal, local anesthetic injected around them, and echogenic block needle trajectory was monitored throughout. Aspiration performed every 70ml. Lung and blood vessels were avoided. All injections were performed without resistance and free of blood and paresthesias. The patient tolerated the procedure well.  Injectate: 54ml exparel + 49ml 0.5% bupivacaine

## 2021-03-01 ENCOUNTER — Encounter: Payer: Self-pay | Admitting: Surgery

## 2021-03-01 NOTE — Anesthesia Postprocedure Evaluation (Signed)
Anesthesia Post Note  Patient: Ariel Hickman  Procedure(s) Performed: SHOULDER ARTHROSCOPY WITH DEBRIDEMENT, DECOMPRESSION, ROTATOR CUFF REPAIR AND BICEPS TENODESIS. (Right: Shoulder)  Patient location during evaluation: PACU Anesthesia Type: General Level of consciousness: awake and alert Pain management: pain level controlled Vital Signs Assessment: post-procedure vital signs reviewed and stable Respiratory status: spontaneous breathing, nonlabored ventilation, respiratory function stable and patient connected to nasal cannula oxygen Cardiovascular status: blood pressure returned to baseline and stable Postop Assessment: no apparent nausea or vomiting Anesthetic complications: no   No notable events documented.   Last Vitals:  Vitals:   02/28/21 1618 02/28/21 1632  BP: (!) 160/70 (!) 147/48  Pulse:  61  Resp:  20  Temp:  (!) 36.1 C  SpO2:  97%    Last Pain:  Vitals:   02/28/21 1632  TempSrc: Temporal  PainSc: 0-No pain                 Corinda Gubler

## 2021-03-06 ENCOUNTER — Ambulatory Visit: Admission: RE | Admit: 2021-03-06 | Payer: Medicare Other | Source: Ambulatory Visit

## 2021-03-06 ENCOUNTER — Other Ambulatory Visit: Payer: Medicare Other

## 2021-03-20 ENCOUNTER — Other Ambulatory Visit: Payer: Medicare Other

## 2021-03-22 ENCOUNTER — Ambulatory Visit
Admission: RE | Admit: 2021-03-22 | Discharge: 2021-03-22 | Disposition: A | Discharge status: Home / Self Care | Payer: Medicare Other | Source: Ambulatory Visit | Attending: Family Medicine | Admitting: Family Medicine

## 2021-03-22 ENCOUNTER — Other Ambulatory Visit: Payer: Self-pay

## 2021-03-22 DIAGNOSIS — N6489 Other specified disorders of breast: Secondary | ICD-10-CM | POA: Insufficient documentation

## 2021-03-22 DIAGNOSIS — R928 Other abnormal and inconclusive findings on diagnostic imaging of breast: Secondary | ICD-10-CM | POA: Diagnosis not present

## 2022-02-06 ENCOUNTER — Encounter: Payer: Medicare Other | Admitting: Nurse Practitioner

## 2022-02-10 NOTE — Progress Notes (Signed)
This encounter was created in error - please disregard.

## 2022-02-24 ENCOUNTER — Emergency Department: Payer: Medicare Other

## 2022-02-24 ENCOUNTER — Other Ambulatory Visit: Payer: Self-pay | Admitting: Family Medicine

## 2022-02-24 ENCOUNTER — Other Ambulatory Visit: Payer: Self-pay

## 2022-02-24 ENCOUNTER — Encounter: Payer: Self-pay | Admitting: Emergency Medicine

## 2022-02-24 ENCOUNTER — Emergency Department
Admission: EM | Admit: 2022-02-24 | Discharge: 2022-02-25 | Discharge status: Against Medical Advice | Payer: Medicare Other | Attending: Emergency Medicine | Admitting: Emergency Medicine

## 2022-02-24 DIAGNOSIS — R0789 Other chest pain: Secondary | ICD-10-CM | POA: Insufficient documentation

## 2022-02-24 DIAGNOSIS — Z5321 Procedure and treatment not carried out due to patient leaving prior to being seen by health care provider: Secondary | ICD-10-CM | POA: Diagnosis not present

## 2022-02-24 NOTE — ED Triage Notes (Signed)
Pt to triage via w/c with no distress noted; reports 3 episdes of mid CP today, nonradiating with no accomp symptoms; st she was eval by EMS who recommended she come for lab testing

## 2022-02-24 NOTE — ED Provider Notes (Signed)
Emergency Medicine Provider Triage Evaluation Note  Ariel Hickman , a 81 y.o. female  was evaluated in triage.  Pt complains of chest pain. Started this AM. Happened again this PM around 330 and then again tonight. Described as tightness. Felt improved with holding chest. Not clearly exertional or pleuritic.   Review of Systems  Positive: CP Negative: SOB, fever, chills cough  Physical Exam  There were no vitals taken for this visit. Gen:   Awake, no distress   Resp:  Normal effort  MSK:   Moves extremities without difficulty  Other:  Tachycardic   Medical Decision Making  Medically screening exam initiated at 11:45 PM.  Appropriate orders placed.  Ariel Hickman was informed that the remainder of the evaluation will be completed by another provider, this initial triage assessment does not replace that evaluation, and the importance of remaining in the ED until their evaluation is complete.     Georga Hacking, MD 02/24/22 [phone]

## 2022-02-25 ENCOUNTER — Emergency Department: Payer: Medicare Other

## 2022-02-25 DIAGNOSIS — R0789 Other chest pain: Secondary | ICD-10-CM | POA: Diagnosis not present

## 2022-02-25 LAB — BASIC METABOLIC PANEL
Anion gap: 8 (ref 5–15)
BUN: 20 mg/dL (ref 8–23)
CO2: 22 mmol/L (ref 22–32)
Calcium: 9.8 mg/dL (ref 8.9–10.3)
Chloride: 109 mmol/L (ref 98–111)
Creatinine, Ser: 0.73 mg/dL (ref 0.44–1.00)
GFR, Estimated: >60 mL/min (ref >60)
Glucose, Bld: 123 mg/dL — ABNORMAL HIGH (ref 70–99)
Potassium: 4.1 mmol/L (ref 3.5–5.1)
Sodium: 139 mmol/L (ref 135–145)

## 2022-02-25 LAB — CBC
HCT: 42.2 % (ref 36.0–46.0)
Hemoglobin: 13.8 g/dL (ref 12.0–15.0)
MCH: 29.5 pg (ref 26.0–34.0)
MCHC: 32.7 g/dL (ref 30.0–36.0)
MCV: 90.2 fL (ref 80.0–100.0)
Platelets: 245 10*3/uL (ref 150–400)
RBC: 4.68 MIL/uL (ref 3.87–5.11)
RDW: 13.3 % (ref 11.5–15.5)
WBC: 6.9 10*3/uL (ref 4.0–10.5)
nRBC: 0 % (ref 0.0–0.2)

## 2022-02-25 LAB — TROPONIN I (HIGH SENSITIVITY): Troponin I (High Sensitivity): 3 ng/L (ref <18)

## 2022-03-03 ENCOUNTER — Other Ambulatory Visit: Payer: Self-pay | Admitting: Family Medicine

## 2022-03-03 DIAGNOSIS — Z1231 Encounter for screening mammogram for malignant neoplasm of breast: Secondary | ICD-10-CM

## 2022-03-27 ENCOUNTER — Ambulatory Visit
Admission: RE | Admit: 2022-03-27 | Discharge: 2022-03-27 | Disposition: A | Discharge status: Home / Self Care | Payer: Medicare Other | Source: Ambulatory Visit | Attending: Family Medicine | Admitting: Family Medicine

## 2022-03-27 DIAGNOSIS — Z1231 Encounter for screening mammogram for malignant neoplasm of breast: Secondary | ICD-10-CM | POA: Diagnosis present

## 2022-09-05 IMAGING — MR MR SHOULDER*R* W/O CM
4 of 5 series · 31 of 40 positions shown · non-contrast
Comparison: None.

CLINICAL DATA: Tennis injury in August 2020 with pain extending
down the arm.

EXAM:
MRI OF THE RIGHT SHOULDER WITHOUT CONTRAST
TECHNIQUE: Multiplanar, multisequence MR imaging of the shoulder was performed.
No intravenous contrast was administered.

[Series 5: T2 fat-sat · axial · right · 4.0mm · 0.44mm/px · z∈[-62,+58]mm · 8 of 26 slices shown (1 of 3)]
[im 1/26]
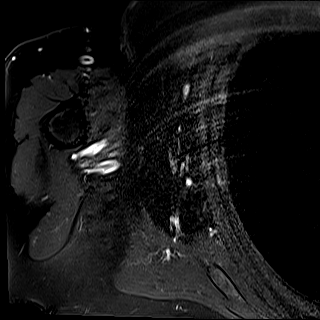
[im 4/26]
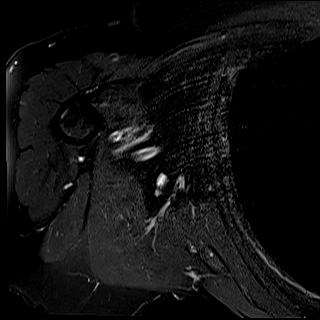
[im 8/26]
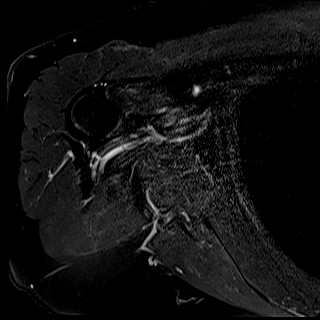
[im 11/26]
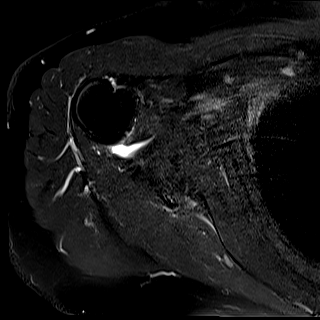
[im 15/26]
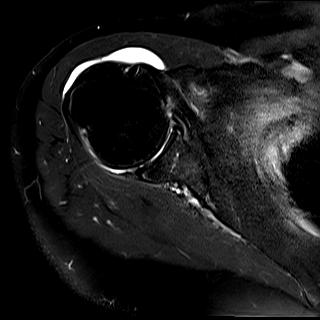
[im 18/26]
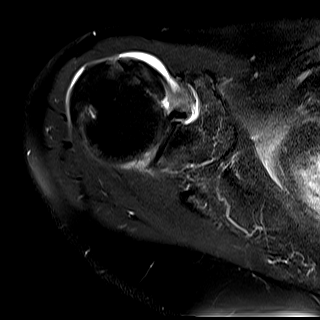
[im 22/26]
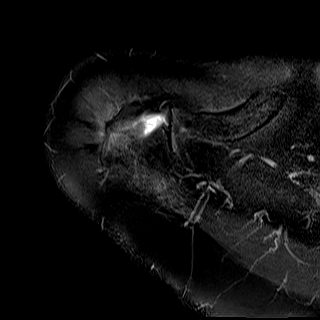
[im 26/26]
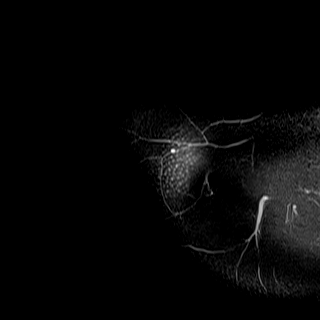

[Series 6: PD · oblique · right · 4.0mm · 0.44mm/px · 9 of 26 slices shown]
[im 1/26]
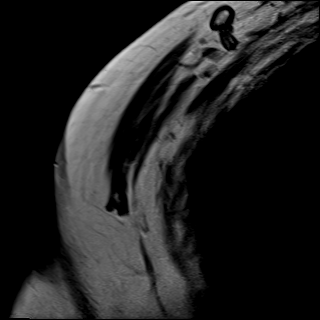
[im 4/26]
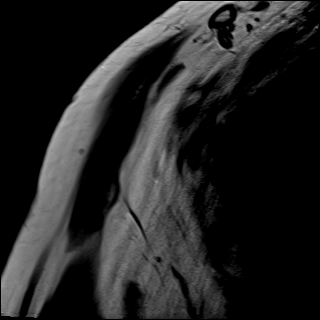
[im 7/26]
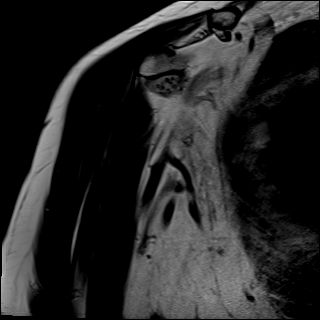
[im 10/26]
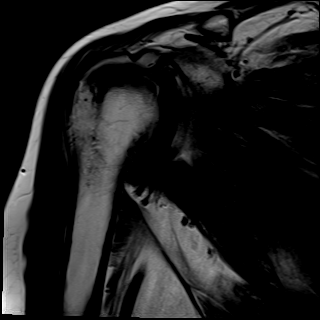
[im 13/26]
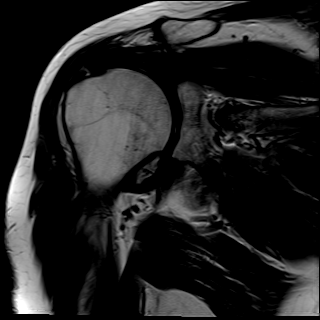
[im 16/26]
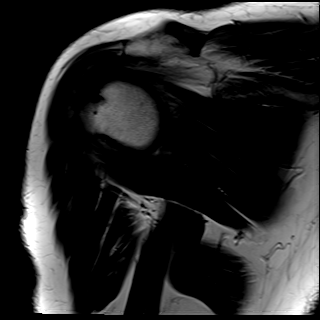
[im 19/26]
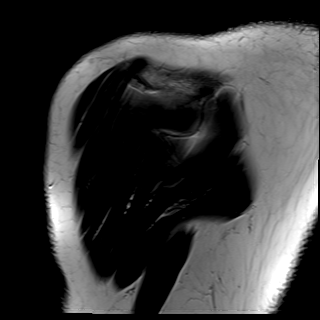
[im 22/26]
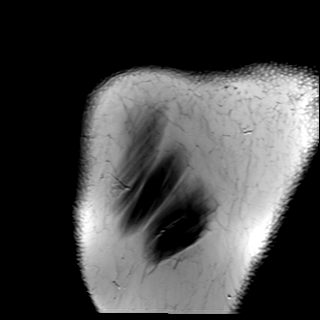
[im 26/26]
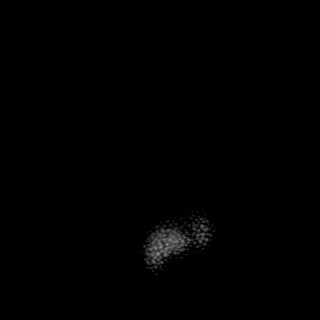

[Series 7: T2 fat-sat · oblique · right · 4.0mm · 0.44mm/px · 9 of 26 slices shown (2 of 3)]
[im 1/26]
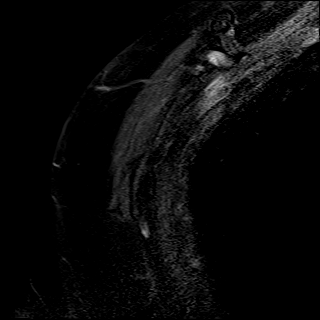
[im 4/26]
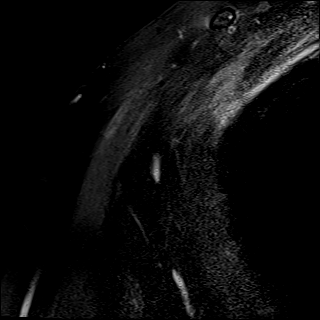
[im 7/26]
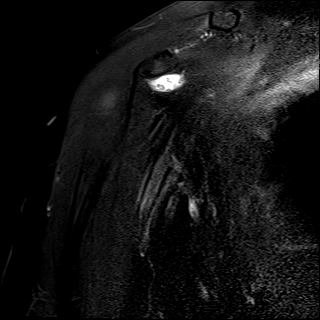
[im 10/26]
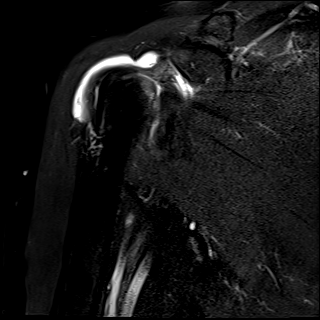
[im 13/26]
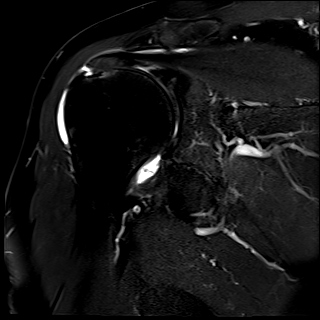
[im 16/26]
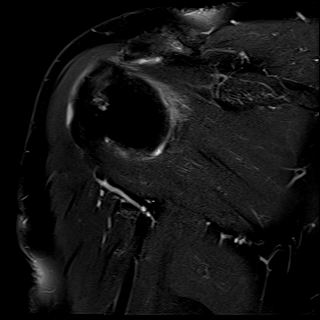
[im 19/26]
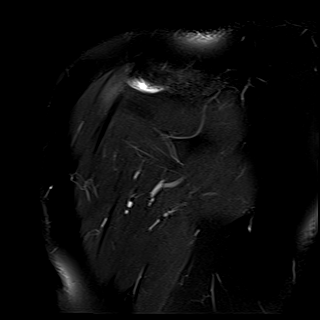
[im 22/26]
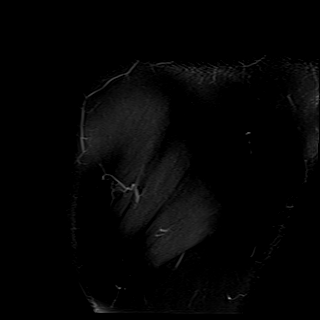
[im 26/26]
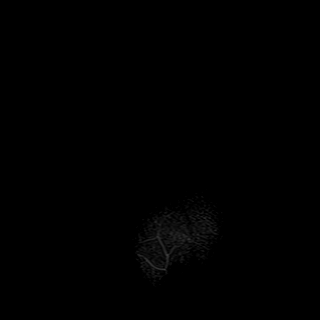

[Series 8: T2 fat-sat · oblique · right · 4.0mm · 0.22mm/px · 5 of 22 slices shown (3 of 3)]
[im 1/22]
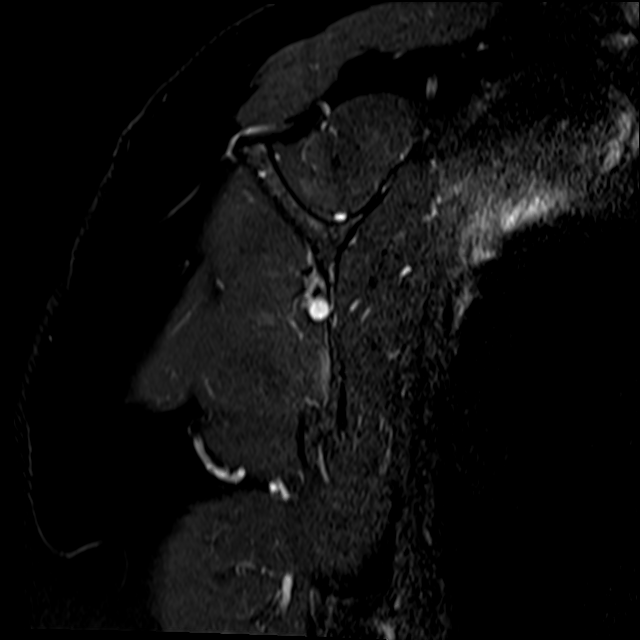
[im 4/22]
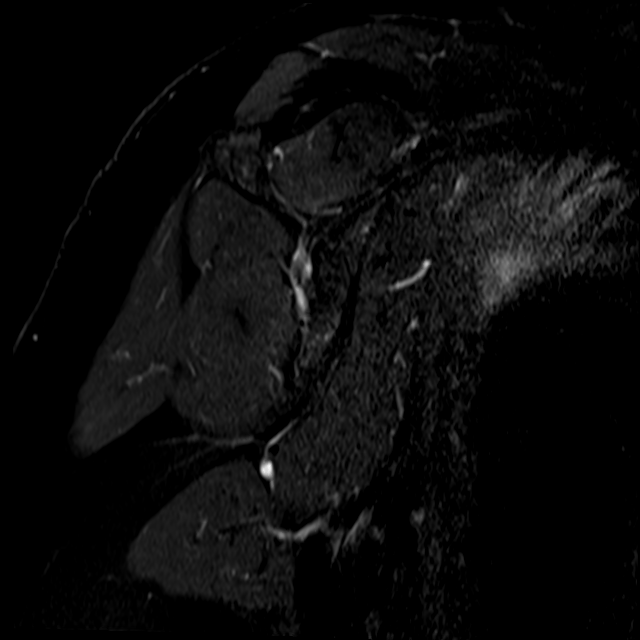
[im 8/22]
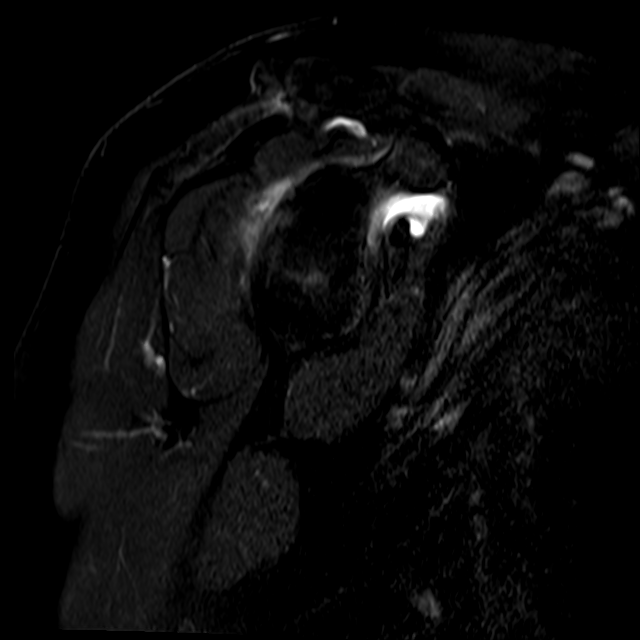
[im 11/22]
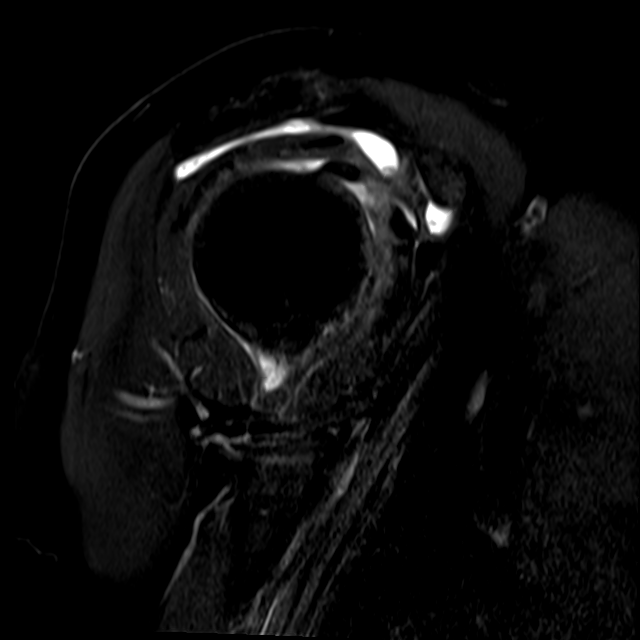
[im 18/22]
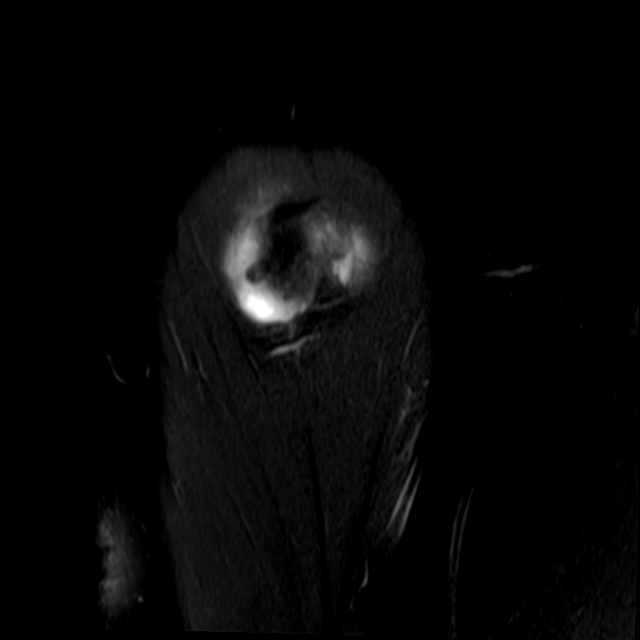

[31 of 40 positions shown; findings below may reference images not displayed]

FINDINGS: Despite efforts by the technologist and patient, motion artifact is
present on today's exam and could not be eliminated. This reduces
exam sensitivity and specificity.

Rotator cuff: Partial thickness bursal surface tear of the distal
supraspinatus tendon on images 14-15 of series 7 without definite
bursal surface extension. Mild supraspinatus tendinopathy.

Muscles: Low-grade edema along the margins of the a left serratus
anterior muscle and potentially along adjacent external intercostal
musculature for example on image 20 of series 7. Some of this
appearance could be from field heterogeneity but regional muscular
or upper rib injury is difficult to confidently exclude. No obvious
impinging lesion along the adjacent axillary neurovascular
structures.

Biceps long head:  Unremarkable

Acromioclavicular Joint: Mild spurring and mild subcortical marrow
edema compatible with mild degenerative AC joint arthropathy. The
spurring indents the top of the supraspinatus muscle. Type II
acromion. Subacromial subdeltoid bursitis is observed.

Glenohumeral Joint: Borderline appearance for joint effusion with
small filling defect along the axillary pouch and in the subscapular
recess probably from synovitis, less likely to be small chondral
fragments. Moderate chondral thinning in the glenohumeral joint.

Labrum:  Grossly unremarkable

Bones:  No additional bony findings.

Other: No supplemental non-categorized findings.
IMPRESSION: 1.
Edema within or along the left serratus anterior muscle and possibly
along the adjacent intercostal musculature. This could be from
muscle strain, but the region is poorly characterized. Consider
chest radiography to also exclude an upper right rib injury.
2. Subacromial subdeltoid bursitis.
3. Partial thickness bursal surface tear of the distal supraspinatus
tendon with mild supraspinatus tendinopathy.
4. Suspected mild synovitis in the glenohumeral joint.

## 2023-01-26 ENCOUNTER — Other Ambulatory Visit: Payer: Self-pay | Admitting: Family Medicine

## 2023-01-26 DIAGNOSIS — Z1231 Encounter for screening mammogram for malignant neoplasm of breast: Secondary | ICD-10-CM

## 2023-04-06 ENCOUNTER — Ambulatory Visit
Admission: RE | Admit: 2023-04-06 | Discharge: 2023-04-06 | Disposition: A | Discharge status: Home / Self Care | Payer: Medicare Other | Source: Ambulatory Visit | Attending: Family Medicine | Admitting: Family Medicine

## 2023-04-06 DIAGNOSIS — Z1231 Encounter for screening mammogram for malignant neoplasm of breast: Secondary | ICD-10-CM | POA: Insufficient documentation

## 2024-03-08 ENCOUNTER — Other Ambulatory Visit: Payer: Self-pay | Admitting: Family Medicine

## 2024-03-08 DIAGNOSIS — Z1231 Encounter for screening mammogram for malignant neoplasm of breast: Secondary | ICD-10-CM

## 2024-04-12 ENCOUNTER — Ambulatory Visit
Admission: RE | Admit: 2024-04-12 | Discharge: 2024-04-12 | Disposition: A | Discharge status: Home / Self Care | Source: Ambulatory Visit | Attending: Family Medicine | Admitting: Family Medicine

## 2024-04-12 DIAGNOSIS — Z1231 Encounter for screening mammogram for malignant neoplasm of breast: Secondary | ICD-10-CM | POA: Insufficient documentation
